# Patient Record
Sex: Female | Born: 1990 | Race: White | Hispanic: No | Marital: Married | Smoking: Never smoker
Health system: Southern US, Community
[De-identification: ages and names within clinical notes are randomized; demographics above are authoritative.]

## PROBLEM LIST (undated history)

## (undated) DIAGNOSIS — A6920 Lyme disease, unspecified: Secondary | ICD-10-CM

## (undated) DIAGNOSIS — F32A Depression, unspecified: Secondary | ICD-10-CM

## (undated) DIAGNOSIS — F419 Anxiety disorder, unspecified: Secondary | ICD-10-CM

## (undated) HISTORY — PX: TONSILLECTOMY: SUR1361

---

## 2019-08-23 ENCOUNTER — Other Ambulatory Visit: Payer: Self-pay

## 2019-08-23 DIAGNOSIS — Z20822 Contact with and (suspected) exposure to covid-19: Secondary | ICD-10-CM

## 2019-08-25 LAB — NOVEL CORONAVIRUS, NAA: SARS-CoV-2, NAA: NOT DETECTED

## 2019-12-26 ENCOUNTER — Ambulatory Visit
Admission: EM | Admit: 2019-12-26 | Discharge: 2019-12-26 | Disposition: A | Discharge status: Home / Self Care | Payer: Self-pay | Attending: Emergency Medicine | Admitting: Emergency Medicine

## 2019-12-26 ENCOUNTER — Other Ambulatory Visit: Payer: Self-pay

## 2019-12-26 DIAGNOSIS — J029 Acute pharyngitis, unspecified: Secondary | ICD-10-CM

## 2019-12-26 LAB — POCT RAPID STREP A (OFFICE): Rapid Strep A Screen: NEGATIVE

## 2019-12-26 MED ORDER — AMOXICILLIN-POT CLAVULANATE 875-125 MG PO TABS: 1.0000 | ORAL_TABLET | Freq: Two times a day (BID) | ORAL | 0 refills | Status: AC

## 2019-12-26 MED ORDER — CEFTRIAXONE SODIUM 1 G IJ SOLR
1.0000 g | Freq: Once | INTRAMUSCULAR | Status: AC
  Administered 2019-12-26: 1 g via INTRAMUSCULAR

## 2019-12-26 MED ORDER — DEXAMETHASONE SODIUM PHOSPHATE 10 MG/ML IJ SOLN
10.0000 mg | Freq: Once | INTRAMUSCULAR | Status: AC
  Administered 2019-12-26: 10 mg via INTRAMUSCULAR

## 2019-12-26 NOTE — Discharge Instructions (Signed)
Based on symptoms and exam I am concerned for soft tissue infection.  Offered further evaluation and management in the ED.  Patient declines and would like to try outpatient therapy first.  Aware of risk associated with this decision including delayed care, missed diagnosis, organ damage, organ failure, and/or diagnosis.  Patient understands and is agreeable to plan listed below  Strep was negative.  Culture sent.   Drink plenty of water and rest 1 gram of rocephin given in office 10 mg of decadron given in office Augmentin prescribed.  Take as directed and to completion Call 911 or go to ER if you have any new or worsening symptoms fever, chills, nausea, worsening sore throat, drooling, inability to swallowing, difficulty breathing, chest pain, trouble swallowing, worsening symptoms despite medication, etc..Marland Kitchen

## 2019-12-26 NOTE — ED Triage Notes (Signed)
Pt presents with headaches, body aches and sore throat x 5 days. Denies fevers at home. Pt reports having covid in January. Denies any other symptoms. Denies relief with otc medications.

## 2019-12-26 NOTE — ED Provider Notes (Signed)
[facility]   [id number] 12/26/19 Arrival Time: 1542   CC: Sore throat  SUBJECTIVE: History from: patient.  Lucy Boardman is a 29 y.o. female who presents with HA, body aches, and sore throat x 5 days.  Denies sick exposure to COVID, flu or strep.  Had COVID in January.  Has tried OTC medications without relief.  Symptoms are made worse with swallowing, but tolerating own secretions and liquids.  Reports previous symptoms in the past with "throat infection."  Complains of muffled sounding voice.    Denies fever, chills, fatigue, sinus pain, rhinorrhea, SOB, wheezing, chest pain, nausea, changes in bowel or bladder habits.    ROS: As per HPI.  All other pertinent ROS negative.     History reviewed. No pertinent past medical history. Past Surgical History:  Procedure Laterality Date  . TONSILLECTOMY     No Known Allergies No current facility-administered medications on file prior to encounter.   No current outpatient medications on file prior to encounter.   Social History   Socioeconomic History  . Marital status: Married    Spouse name: Not on file  . Number of children: Not on file  . Years of education: Not on file  . Highest education level: Not on file  Occupational History  . Not on file  Tobacco Use  . Smoking status: Never Smoker  . Smokeless tobacco: Never Used  Substance and Sexual Activity  . Alcohol use: Yes    Comment: weekly  . Drug use: Never  . Sexual activity: Not on file  Other Topics Concern  . Not on file  Social History Narrative  . Not on file   Social Determinants of Health   Financial Resource Strain:   . Difficulty of Paying Living Expenses:   Food Insecurity:   . Worried About Programme researcher, broadcasting/film/video in the Last Year:   . Barista in the Last Year:   Transportation Needs:   . Freight forwarder (Medical):   Marland Kitchen Lack of Transportation (Non-Medical):   Physical Activity:   . Days of Exercise per Week:   . Minutes of  Exercise per Session:   Stress:   . Feeling of Stress :   Social Connections:   . Frequency of Communication with Friends and Family:   . Frequency of Social Gatherings with Friends and Family:   . Attends Religious Services:   . Active Member of Clubs or Organizations:   . Attends Banker Meetings:   Marland Kitchen Marital Status:   Intimate Partner Violence:   . Fear of Current or Ex-Partner:   . Emotionally Abused:   Marland Kitchen Physically Abused:   . Sexually Abused:    Family History  Problem Relation Age of Onset  . Heart disease Mother   . Healthy Father     OBJECTIVE:  Vitals:   12/26/19 1554  BP: 119/81  Pulse: (!) 126  Resp: 18  Temp: 99.9 F (37.7 C)  SpO2: 95%     General appearance: alert; appears fatigued, but nontoxic; speaking in full sentences HEENT: NCAT; Ears: EACs clear, TMs pearly gray; Eyes: PERRL.  EOM grossly intact. Nose: nares patent without rhinorrhea, Throat: oropharynx clear, tonsils absent, soft palate erythematous, uvula midline, no obvious abscess, muffled voice, tolerating secretions Neck: supple without LAD Lungs: unlabored respirations, symmetrical air entry; cough: absent; no respiratory distress; CTAB Heart: Tachycardic Skin: warm and dry Psychological: alert and cooperative; normal mood and affect  LABS:  Results for orders  placed or performed during the hospital encounter of 12/26/19 (from the past 24 hour(s))  POCT rapid strep A     Status: None   Collection Time: 12/26/19  4:06 PM  Result Value Ref Range   Rapid Strep A Screen Negative Negative    ASSESSMENT & PLAN:  1. Throat infection   2. Sore throat     Meds ordered this encounter  Medications  . dexamethasone (DECADRON) injection 10 mg  . cefTRIAXone (ROCEPHIN) injection 1 g  . amoxicillin-clavulanate (AUGMENTIN) 875-125 MG tablet    Sig: Take 1 tablet by mouth every 12 (twelve) hours for 10 days.    Dispense:  20 tablet    Refill:  0    Order Specific Question:    Supervising Provider    Answer:   Raylene Everts [[id number]]   Based on symptoms and exam I am concerned for soft tissue infection.  Offered further evaluation and management in the ED.  Patient declines and would like to try outpatient therapy first.  Aware of risk associated with this decision including delayed care, missed diagnosis, organ damage, organ failure, and/or diagnosis.  Patient understands and is agreeable to plan listed below  Strep was negative.  Culture sent.   Drink plenty of water and rest 1 gram of rocephin given in office 10 mg of decadron given in office Augmentin prescribed.  Take as directed and to completion Call 911 or go to ER if you have any new or worsening symptoms fever, chills, nausea, worsening sore throat, drooling, inability to swallowing, difficulty breathing, chest pain, trouble swallowing, worsening symptoms despite medication, etc...  Reviewed expectations re: course of current medical issues. Questions answered. Outlined signs and symptoms indicating need for more acute intervention. Patient verbalized understanding. After Visit Summary given.         Lestine Box, PA-C 12/26/19 1616

## 2019-12-29 LAB — CULTURE, GROUP A STREP (THRC)

## 2020-04-15 ENCOUNTER — Encounter: Payer: Self-pay | Admitting: Emergency Medicine

## 2020-04-15 ENCOUNTER — Ambulatory Visit
Admission: EM | Admit: 2020-04-15 | Discharge: 2020-04-15 | Disposition: A | Discharge status: Home / Self Care | Attending: Emergency Medicine | Admitting: Emergency Medicine

## 2020-04-15 ENCOUNTER — Other Ambulatory Visit: Payer: Self-pay

## 2020-04-15 DIAGNOSIS — R519 Headache, unspecified: Secondary | ICD-10-CM | POA: Diagnosis present

## 2020-04-15 DIAGNOSIS — R000 Tachycardia, unspecified: Secondary | ICD-10-CM

## 2020-04-15 DIAGNOSIS — J029 Acute pharyngitis, unspecified: Secondary | ICD-10-CM

## 2020-04-15 HISTORY — DX: Depression, unspecified: F32.A

## 2020-04-15 HISTORY — DX: Anxiety disorder, unspecified: F41.9

## 2020-04-15 LAB — POCT RAPID STREP A (OFFICE): Rapid Strep A Screen: NEGATIVE

## 2020-04-15 MED ORDER — IBUPROFEN 800 MG PO TABS
800.0000 mg | ORAL_TABLET | Freq: Three times a day (TID) | ORAL | 0 refills | Status: DC
Start: 2020-04-15 — End: 2020-06-22

## 2020-04-15 MED ORDER — CETIRIZINE HCL 10 MG PO TABS: 10.0000 mg | ORAL_TABLET | Freq: Every day | ORAL | 0 refills | Status: AC

## 2020-04-15 NOTE — Discharge Instructions (Addendum)
Strep negative culture sent.  We will follow up with you regarding abnormal results COVID testing ordered.  It will take between 2-5 days for test results.  Someone will contact you regarding abnormal results.    In the meantime: You should remain isolated in your home for 10 days from symptom onset AND greater than 72 hours after symptoms resolution (absence of fever without the use of fever-reducing medication and improvement in respiratory symptoms), whichever is longer Get plenty of rest and push fluids zyrtec for nasal congestion, runny nose, and/or sore throat Ibuprofen prescribed.  Use as directed for sore throat and headache Call or go to the ED if you have any new or worsening symptoms such as fever, cough, shortness of breath, chest tightness, chest pain, turning blue, changes in mental status, etc..Marland Kitchen

## 2020-04-15 NOTE — ED Triage Notes (Signed)
Sore throat and headache x3 days

## 2020-04-15 NOTE — ED Provider Notes (Signed)
[facility]   [id number] 04/15/20 Arrival Time: 0857   CC: COVID symptoms  SUBJECTIVE: History from: patient.  Kelly Holt is a 29 y.o. female who presents with abrupt onset of headache and sore throat x few days.  Denies sick exposure to COVID, flu or strep.   Has tried OTC medications without relief.  Symptoms are made worse with swallowing, but tolerating own secretions.  Reports previous symptoms in the past with throat infection.   Denies fever, chills, sinus pain, rhinorrhea, cough, SOB, wheezing, chest pain, nausea, vomiting, changes in bowel or bladder habits.     ROS: As per HPI.  All other pertinent ROS negative.     Past Medical History:  Diagnosis Date  . Anxiety   . Depression    Past Surgical History:  Procedure Laterality Date  . TONSILLECTOMY     No Known Allergies No current facility-administered medications on file prior to encounter.   Current Outpatient Medications on File Prior to Encounter  Medication Sig Dispense Refill  . FLUoxetine (PROZAC) 20 MG tablet Take 20 mg by mouth daily.     Social History   Socioeconomic History  . Marital status: Married    Spouse name: Not on file  . Number of children: Not on file  . Years of education: Not on file  . Highest education level: Not on file  Occupational History  . Not on file  Tobacco Use  . Smoking status: Never Smoker  . Smokeless tobacco: Never Used  Substance and Sexual Activity  . Alcohol use: Yes    Comment: weekly  . Drug use: Never  . Sexual activity: Not on file  Other Topics Concern  . Not on file  Social History Narrative  . Not on file   Social Determinants of Health   Financial Resource Strain:   . Difficulty of Paying Living Expenses:   Food Insecurity:   . Worried About Programme researcher, broadcasting/film/video in the Last Year:   . Barista in the Last Year:   Transportation Needs:   . Freight forwarder (Medical):   Marland Kitchen Lack of Transportation (Non-Medical):   Physical  Activity:   . Days of Exercise per Week:   . Minutes of Exercise per Session:   Stress:   . Feeling of Stress :   Social Connections:   . Frequency of Communication with Friends and Family:   . Frequency of Social Gatherings with Friends and Family:   . Attends Religious Services:   . Active Member of Clubs or Organizations:   . Attends Banker Meetings:   Marland Kitchen Marital Status:   Intimate Partner Violence:   . Fear of Current or Ex-Partner:   . Emotionally Abused:   Marland Kitchen Physically Abused:   . Sexually Abused:    Family History  Problem Relation Age of Onset  . Heart disease Mother   . Healthy Father     OBJECTIVE:  Vitals:   04/15/20 0928  BP: 124/76  Pulse: (!) 125  Resp: 18  Temp: 99.6 F (37.6 C)  TempSrc: Oral  SpO2: 97%  Weight: 160 lb (72.6 kg)  Height: 5\' 7"  (1.702 m)     General appearance: alert; appears fatigued, but nontoxic; speaking in full sentences and tolerating own secretions HEENT: NCAT; Ears: EACs clear, TMs pearly gray; Eyes: PERRL.  EOM grossly intact.  Nose: nares patent without rhinorrhea, Throat: oropharynx clear, tonsils erythematous not enlarged, uvula midline  Neck: supple without LAD Lungs:  unlabored respirations, symmetrical air entry; cough: absent; no respiratory distress; CTAB Heart: Tachycardic Skin: warm and dry Psychological: alert and cooperative; normal mood and affect  LABS:  Results for orders placed or performed during the hospital encounter of 04/15/20 (from the past 24 hour(s))  POCT rapid strep A     Status: None   Collection Time: 04/15/20  9:32 AM  Result Value Ref Range   Rapid Strep A Screen Negative Negative     ASSESSMENT & PLAN:  1. Sore throat   2. Acute nonintractable headache, unspecified headache type     Meds ordered this encounter  Medications  . cetirizine (ZYRTEC) 10 MG tablet    Sig: Take 1 tablet (10 mg total) by mouth daily.    Dispense:  30 tablet    Refill:  0    Order Specific  Question:   Supervising Provider    Answer:   Eustace Moore [[id number]]  . ibuprofen (ADVIL) 800 MG tablet    Sig: Take 1 tablet (800 mg total) by mouth 3 (three) times daily.    Dispense:  21 tablet    Refill:  0    Order Specific Question:   Supervising Provider    Answer:   Eustace Moore [[id number]]   Strep negative culture sent.  We will follow up with you regarding abnormal results COVID testing ordered.  It will take between 2-5 days for test results.  Someone will contact you regarding abnormal results.    In the meantime: You should remain isolated in your home for 10 days from symptom onset AND greater than 72 hours after symptoms resolution (absence of fever without the use of fever-reducing medication and improvement in respiratory symptoms), whichever is longer Get plenty of rest and push fluids zyrtec for nasal congestion, runny nose, and/or sore throat Ibuprofen prescribed.  Use as directed for sore throat and headache Call or go to the ED if you have any new or worsening symptoms such as fever, cough, shortness of breath, chest tightness, chest pain, turning blue, changes in mental status, etc...   Heart rate elevated in office.  Was elevated during her last visit as well.  States this is normal.  Encouraged patient to establish primary care for further work-up as needed.    Reviewed expectations re: course of current medical issues. Questions answered. Outlined signs and symptoms indicating need for more acute intervention. Patient verbalized understanding. After Visit Summary given.         Alvino [place], PA-C 04/15/20 [phone]

## 2020-04-16 LAB — SARS-COV-2, NAA 2 DAY TAT

## 2020-04-16 LAB — NOVEL CORONAVIRUS, NAA: SARS-CoV-2, NAA: NOT DETECTED

## 2020-04-17 ENCOUNTER — Ambulatory Visit
Admission: EM | Admit: 2020-04-17 | Discharge: 2020-04-17 | Disposition: A | Discharge status: Home / Self Care | Attending: Family Medicine | Admitting: Family Medicine

## 2020-04-17 ENCOUNTER — Other Ambulatory Visit: Payer: Self-pay

## 2020-04-17 DIAGNOSIS — R509 Fever, unspecified: Secondary | ICD-10-CM

## 2020-04-17 DIAGNOSIS — R000 Tachycardia, unspecified: Secondary | ICD-10-CM | POA: Diagnosis not present

## 2020-04-17 DIAGNOSIS — J029 Acute pharyngitis, unspecified: Secondary | ICD-10-CM | POA: Diagnosis not present

## 2020-04-17 MED ORDER — CEFTRIAXONE SODIUM 500 MG IJ SOLR
500.0000 mg | Freq: Once | INTRAMUSCULAR | Status: AC
  Administered 2020-04-17: 500 mg via INTRAMUSCULAR

## 2020-04-17 MED ORDER — FLUCONAZOLE 200 MG PO TABS: 200.0000 mg | ORAL_TABLET | Freq: Once | ORAL | 0 refills | Status: AC

## 2020-04-17 MED ORDER — AMOXICILLIN-POT CLAVULANATE 875-125 MG PO TABS: 1.0000 | ORAL_TABLET | Freq: Two times a day (BID) | ORAL | 0 refills | Status: AC

## 2020-04-17 MED ORDER — METHYLPREDNISOLONE SODIUM SUCC 125 MG IJ SOLR
125.0000 mg | Freq: Once | INTRAMUSCULAR | Status: AC
  Administered 2020-04-17: 125 mg via INTRAMUSCULAR

## 2020-04-17 NOTE — Discharge Instructions (Addendum)
We have given you Rocephin (antibiotic) and solumedrol (steroid) in the office today  I have sent Augmentin to your pharmacy  Follow up with this office or with primary care if you are not improving over the next 2 days on antibiotics  Follow up with the ER for trouble swallowing, trouble breathing, other concerning symptoms

## 2020-04-17 NOTE — ED Provider Notes (Signed)
[facility]   [id number] 04/17/20 Arrival Time: 1618  GH:WEXH THROAT  SUBJECTIVE: History from: patient.  Kelly Holt is a 29 y.o. female who presents with sore throat, fever, chills, fatigue for the last 4-5 days. Was seen in this office 2 days ago and had negative rapid strep and negative Covid testing. Strep culture is still pending. Has been taking ibuprofen and zyrtec with no relief. Reports that her throat feels more swollen than it did at her last visit. Symptoms are made worse with swallowing, but tolerating liquids and own secretions without difficulty.  Reports previous symptoms in the past.     Denies  ear pain, sinus pain, rhinorrhea, nasal congestion, cough, SOB, wheezing, chest pain, nausea, rash, changes in bowel or bladder habits.     ROS: As per HPI.  All other pertinent ROS negative.     Past Medical History:  Diagnosis Date  . Anxiety   . Depression    Past Surgical History:  Procedure Laterality Date  . TONSILLECTOMY     No Known Allergies No current facility-administered medications on file prior to encounter.   Current Outpatient Medications on File Prior to Encounter  Medication Sig Dispense Refill  . cetirizine (ZYRTEC) 10 MG tablet Take 1 tablet (10 mg total) by mouth daily. 30 tablet 0  . FLUoxetine (PROZAC) 20 MG tablet Take 20 mg by mouth daily.    Marland Kitchen ibuprofen (ADVIL) 800 MG tablet Take 1 tablet (800 mg total) by mouth 3 (three) times daily. 21 tablet 0   Social History   Socioeconomic History  . Marital status: Married    Spouse name: Not on file  . Number of children: Not on file  . Years of education: Not on file  . Highest education level: Not on file  Occupational History  . Not on file  Tobacco Use  . Smoking status: Never Smoker  . Smokeless tobacco: Never Used  Substance and Sexual Activity  . Alcohol use: Yes    Comment: weekly  . Drug use: Never  . Sexual activity: Not on file  Other Topics Concern  . Not on file    Social History Narrative  . Not on file   Social Determinants of Health   Financial Resource Strain:   . Difficulty of Paying Living Expenses:   Food Insecurity:   . Worried About Programme researcher, broadcasting/film/video in the Last Year:   . Barista in the Last Year:   Transportation Needs:   . Freight forwarder (Medical):   Marland Kitchen Lack of Transportation (Non-Medical):   Physical Activity:   . Days of Exercise per Week:   . Minutes of Exercise per Session:   Stress:   . Feeling of Stress :   Social Connections:   . Frequency of Communication with Friends and Family:   . Frequency of Social Gatherings with Friends and Family:   . Attends Religious Services:   . Active Member of Clubs or Organizations:   . Attends Banker Meetings:   Marland Kitchen Marital Status:   Intimate Partner Violence:   . Fear of Current or Ex-Partner:   . Emotionally Abused:   Marland Kitchen Physically Abused:   . Sexually Abused:    Family History  Problem Relation Age of Onset  . Heart disease Mother   . Healthy Father     OBJECTIVE:  Vitals:   04/17/20 1639  BP: 115/75  Pulse: (!) 127  Resp: 20  Temp: 99.4 F (37.4  C)  SpO2: 97%     General appearance: alert; appears fatigued, but nontoxic, speaking in full sentences and managing own secretions HEENT: NCAT; Ears: EACs clear, TMs pearly gray with visible cone of light, without erythema; Eyes: PERRL, EOMI grossly; Nose: no obvious rhinorrhea; Throat: oropharynx erythematous and +1without white tonsillar exudates, uvula midline Neck: supple with bilateral LAD Lungs: CTA bilaterally without adventitious breath sounds; cough absent Heart: regular rate and rhythm.  Radial pulses 2+ symmetrical bilaterally Skin: warm and dry Psychological: alert and cooperative; normal mood and affect  LABS: No results found for this or any previous visit (from the past 24 hour(s)).   ASSESSMENT & PLAN:  1. Throat infection   2. Fever, unspecified fever cause   3.  Tachycardia     Meds ordered this encounter  Medications  . methylPREDNISolone sodium succinate (SOLU-MEDROL) 125 mg/2 mL injection 125 mg  . cefTRIAXone (ROCEPHIN) injection 500 mg  . amoxicillin-clavulanate (AUGMENTIN) 875-125 MG tablet    Sig: Take 1 tablet by mouth 2 (two) times daily for 10 days.    Dispense:  20 tablet    Refill:  0    Order Specific Question:   Supervising Provider    Answer:   Merrilee Jansky [id number]  . fluconazole (DIFLUCAN) 200 MG tablet    Sig: Take 1 tablet (200 mg total) by mouth once for 1 dose.    Dispense:  2 tablet    Refill:  0    Order Specific Question:   Supervising Provider    Answer:   Merrilee Jansky [[id number]]    Given solumedrol in office Given Rocephin in office Prescribed Augmentin Prescribed fluconazole in case of yeast Strep culture still pending Drink warm or cool liquids, use throat lozenges, or popsicles to help alleviate symptoms Take OTC ibuprofen or tylenol as needed for pain Follow up with PCP if symptoms persists Return or go to ER if patient has any new or worsening symptoms such as fever, chills, nausea, vomiting, worsening sore throat, cough, abdominal pain, chest pain, changes in bowel or bladder habits  Reviewed expectations re: course of current medical issues. Questions answered. Outlined signs and symptoms indicating need for more acute intervention. Patient verbalized understanding. After Visit Summary given.          Moshe Cipro, NP 04/17/20 1747

## 2020-04-17 NOTE — ED Triage Notes (Signed)
Pt has worsening sore throat , cultures pending

## 2020-04-18 LAB — CULTURE, GROUP A STREP (THRC)

## 2020-06-22 ENCOUNTER — Ambulatory Visit (HOSPITAL_COMMUNITY)
Admission: RE | Admit: 2020-06-22 | Discharge: 2020-06-22 | Disposition: A | Discharge status: Home / Self Care | Source: Ambulatory Visit | Attending: Emergency Medicine | Admitting: Emergency Medicine

## 2020-06-22 ENCOUNTER — Other Ambulatory Visit: Payer: Self-pay

## 2020-06-22 ENCOUNTER — Ambulatory Visit
Admission: EM | Admit: 2020-06-22 | Discharge: 2020-06-22 | Disposition: A | Discharge status: Home / Self Care | Attending: Emergency Medicine | Admitting: Emergency Medicine

## 2020-06-22 DIAGNOSIS — M79642 Pain in left hand: Secondary | ICD-10-CM

## 2020-06-22 DIAGNOSIS — X58XXXA Exposure to other specified factors, initial encounter: Secondary | ICD-10-CM | POA: Insufficient documentation

## 2020-06-22 DIAGNOSIS — S6992XA Unspecified injury of left wrist, hand and finger(s), initial encounter: Secondary | ICD-10-CM | POA: Diagnosis present

## 2020-06-22 MED ORDER — IBUPROFEN 800 MG PO TABS: 800.0000 mg | ORAL_TABLET | Freq: Three times a day (TID) | ORAL | 0 refills | Status: AC
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
## 2020-06-22 NOTE — Discharge Instructions (Addendum)
Take ibuprofen 800 mg as prescribed for pain Complete x-ray that was ordered.  We will call you if your result is abnormal. Follow-up with PCP/orthopedic Return or go to ED for worsening of symptoms

## 2020-10-27 ENCOUNTER — Ambulatory Visit
Admission: EM | Admit: 2020-10-27 | Discharge: 2020-10-27 | Disposition: A | Discharge status: Home / Self Care | Attending: Emergency Medicine | Admitting: Emergency Medicine

## 2020-10-27 ENCOUNTER — Other Ambulatory Visit: Payer: Self-pay

## 2020-10-27 ENCOUNTER — Encounter: Payer: Self-pay | Admitting: Emergency Medicine

## 2020-10-27 DIAGNOSIS — R52 Pain, unspecified: Secondary | ICD-10-CM

## 2020-10-27 DIAGNOSIS — R110 Nausea: Secondary | ICD-10-CM | POA: Diagnosis not present

## 2020-10-27 DIAGNOSIS — R519 Headache, unspecified: Secondary | ICD-10-CM

## 2020-10-27 DIAGNOSIS — Z1152 Encounter for screening for COVID-19: Secondary | ICD-10-CM

## 2020-10-27 DIAGNOSIS — R509 Fever, unspecified: Secondary | ICD-10-CM

## 2020-10-27 MED ORDER — PREDNISONE 10 MG PO TABS
20.0000 mg | ORAL_TABLET | Freq: Every day | ORAL | 0 refills | Status: AC
Start: 2020-10-27 — End: 2020-11-01

## 2020-10-27 MED ORDER — ONDANSETRON 4 MG PO TBDP: 4.0000 mg | ORAL_TABLET | Freq: Three times a day (TID) | ORAL | 0 refills | Status: AC | PRN

## 2020-10-27 MED ORDER — ACETAMINOPHEN 325 MG PO TABS
650.0000 mg | ORAL_TABLET | Freq: Once | ORAL | Status: AC
  Administered 2020-10-27: 650 mg via ORAL

## 2020-10-27 NOTE — ED Triage Notes (Signed)
Pt states that she has a fever , nausea, HA, sweats/chills, and body aches that started this morning.

## 2020-10-27 NOTE — ED Provider Notes (Signed)
[facility]   [id number] 10/27/20 Arrival Time: 1748   CC: COVID symptoms  SUBJECTIVE: History from: patient.  Kelly Holt is a 30 y.o. female who who presented to the urgent care with a complaint of chills, fever, nausea, headache and body ache that started this morning.  Denies sick exposure to COVID, flu or strep.  Denies recent travel.  Has tried OTC Tylenol with mild relief.  Denies alleviating or aggravating factors.  Denies previous symptoms in the past.   Denies fatigue, sinus pain, rhinorrhea, sore throat, SOB, wheezing, chest pain, nausea, changes in bowel or bladder habits.     ROS: As per HPI.  All other pertinent ROS negative.      Past Medical History:  Diagnosis Date  . Anxiety   . Depression    Past Surgical History:  Procedure Laterality Date  . TONSILLECTOMY     No Known Allergies No current facility-administered medications on file prior to encounter.   Current Outpatient Medications on File Prior to Encounter  Medication Sig Dispense Refill  . cetirizine (ZYRTEC) 10 MG tablet Take 1 tablet (10 mg total) by mouth daily. 30 tablet 0  . FLUoxetine (PROZAC) 20 MG tablet Take 20 mg by mouth daily.    Marland Kitchen ibuprofen (ADVIL) 800 MG tablet Take 1 tablet (800 mg total) by mouth 3 (three) times daily. 30 tablet 0   Social History   Socioeconomic History  . Marital status: Married    Spouse name: Not on file  . Number of children: Not on file  . Years of education: Not on file  . Highest education level: Not on file  Occupational History  . Not on file  Tobacco Use  . Smoking status: Never Smoker  . Smokeless tobacco: Never Used  Substance and Sexual Activity  . Alcohol use: Yes    Comment: weekly  . Drug use: Never  . Sexual activity: Not on file  Other Topics Concern  . Not on file  Social History Narrative  . Not on file   Social Determinants of Health   Financial Resource Strain: Not on file  Food Insecurity: Not on file  Transportation  Needs: Not on file  Physical Activity: Not on file  Stress: Not on file  Social Connections: Not on file  Intimate Partner Violence: Not on file   Family History  Problem Relation Age of Onset  . Heart disease Mother   . Healthy Father     OBJECTIVE:  Vitals:   10/27/20 1801  BP: 128/84  Pulse: (!) 126  Resp: 19  Temp: (!) 100.6 F (38.1 C)  TempSrc: Oral  SpO2: 96%     General appearance: alert; appears fatigued, but nontoxic; speaking in full sentences and tolerating own secretions HEENT: NCAT; Ears: EACs clear, TMs pearly gray; Eyes: PERRL.  EOM grossly intact. Sinuses: nontender; Nose: nares patent without rhinorrhea, Throat: oropharynx clear, tonsils non erythematous or enlarged, uvula midline  Neck: supple without LAD Lungs: unlabored respirations, symmetrical air entry; cough:none; no respiratory distress; CTAB Heart: regular rate and rhythm.  Radial pulses 2+ symmetrical bilaterally Skin: warm and dry Psychological: alert and cooperative; normal mood and affect  LABS:  No results found for this or any previous visit (from the past 24 hour(s)).   ASSESSMENT & PLAN:  1. Encounter for screening for COVID-19   2. Chills with fever   3. Nausea without vomiting   4. Acute nonintractable headache, unspecified headache type   5. Generalized body aches  Meds ordered this encounter  Medications  . acetaminophen (TYLENOL) tablet 650 mg  . ondansetron (ZOFRAN ODT) 4 MG disintegrating tablet    Sig: Take 1 tablet (4 mg total) by mouth every 8 (eight) hours as needed for nausea or vomiting.    Dispense:  30 tablet    Refill:  0  . predniSONE (DELTASONE) 10 MG tablet    Sig: Take 2 tablets (20 mg total) by mouth daily for 5 days.    Dispense:  10 tablet    Refill:  0    Discharge Instructions  COVID-19 testing ordered.  It will take between 2-7 days for test results.  Someone will contact you regarding abnormal results.    Get plenty of rest and push  fluids Zofran was prescribed for nausea e prednisone was prescribed Use OTC medications like ibuprofen or tylenol as needed fever or pain Use medications daily for symptomatic relief Call or go to the ED if you have any new or worsening symptoms such as fever, worsening cough, shortness of breath, chest tightness, chest pain, turning blue, changes in mental status, etc...   Reviewed expectations re: course of current medical issues. Questions answered. Outlined signs and symptoms indicating need for more acute intervention. Patient verbalized understanding. After Visit Summary given.         Durward Parcel, FNP 10/27/20 [phone]

## 2020-10-27 NOTE — Discharge Instructions (Signed)
COVID-19 testing ordered.  It will take between 2-7 days for test results.  Someone will contact you regarding abnormal results.    Get plenty of rest and push fluids Zofran was prescribed for nausea e prednisone was prescribed Use OTC medications like ibuprofen or tylenol as needed fever or pain Use medications daily for symptomatic relief Call or go to the ED if you have any new or worsening symptoms such as fever, worsening cough, shortness of breath, chest tightness, chest pain, turning blue, changes in mental status, etc..Marland Kitchen

## 2020-10-28 LAB — NOVEL CORONAVIRUS, NAA: SARS-CoV-2, NAA: NOT DETECTED

## 2020-10-28 LAB — SARS-COV-2, NAA 2 DAY TAT

## 2021-09-01 ENCOUNTER — Encounter (HOSPITAL_COMMUNITY): Payer: Self-pay | Admitting: *Deleted

## 2021-09-01 ENCOUNTER — Emergency Department (HOSPITAL_COMMUNITY)
Admission: EM | Admit: 2021-09-01 | Discharge: 2021-09-01 | Disposition: A | Discharge status: Home / Self Care | Attending: Emergency Medicine | Admitting: Emergency Medicine

## 2021-09-01 ENCOUNTER — Other Ambulatory Visit: Payer: Self-pay

## 2021-09-01 DIAGNOSIS — K068 Other specified disorders of gingiva and edentulous alveolar ridge: Secondary | ICD-10-CM

## 2021-09-01 DIAGNOSIS — K1379 Other lesions of oral mucosa: Secondary | ICD-10-CM | POA: Insufficient documentation

## 2021-09-01 NOTE — ED Provider Notes (Addendum)
[facility] EMERGENCY DEPARTMENT Provider Note   CSN: [id number] Arrival date & time: 09/01/21  2031     History Chief Complaint  Patient presents with   Oral Swelling    Kelly Holt is a 30 y.o. female.  30 year old female presents today for evaluation of pain in her gums that has been going on since Thursday.  She reports pain is worse when chewing, or brushing her teeth.  She denies difficulty swallowing, sore throat, or fever.  She has not had dental care in 2 to 3 years.  [facility] and was prescribed lidocaine mouthwash which she reports has not provided her with much relief.  She does report Motrin gives her some relief.  The history is provided by the patient. No language interpreter was used.      Past Medical History:  Diagnosis Date   Anxiety    Depression     There are no problems to display for this patient.   Past Surgical History:  Procedure Laterality Date   TONSILLECTOMY       OB History   No obstetric history on file.     Family History  Problem Relation Age of Onset   Heart disease Mother    Healthy Father     Social History   Tobacco Use   Smoking status: Never   Smokeless tobacco: Never  Substance Use Topics   Alcohol use: Yes    Comment: weekly   Drug use: Never    Home Medications Prior to Admission medications   Medication Sig Start Date End Date Taking? Authorizing Provider  cetirizine (ZYRTEC) 10 MG tablet Take 1 tablet (10 mg total) by mouth daily. 04/15/20   Wurst, [place], PA-C  FLUoxetine (PROZAC) 20 MG tablet Take 20 mg by mouth daily.    [provider]  ibuprofen (ADVIL) 800 MG tablet Take 1 tablet (800 mg total) by mouth 3 (three) times daily. 06/22/20   Avegno, Zachery Dakins, FNP  ondansetron (ZOFRAN ODT) 4 MG disintegrating tablet Take 1 tablet (4 mg total) by mouth every 8 (eight) hours as needed for nausea or vomiting. 10/27/20   Durward Parcel, FNP    Allergies     Patient has no known allergies.  Review of Systems   Review of Systems  Constitutional:  Negative for activity change, chills and fever.  HENT:  Negative for sore throat, trouble swallowing and voice change.   Respiratory:  Negative for cough and shortness of breath.   Gastrointestinal:  Negative for nausea.  Skin:  Negative for wound.  Hematological:  Does not bruise/bleed easily.  All other systems reviewed and are negative.  Physical Exam Updated Vital Signs BP 130/90 (BP Location: Right Arm)    Pulse (!) 110    Temp 98.4 F (36.9 C) (Oral)    Resp 16    Ht 5\' 7"  (1.702 m)    Wt 83.9 kg    LMP 08/04/2021    SpO2 100%    BMI 28.98 kg/m   Physical Exam Vitals and nursing note reviewed.  Constitutional:      General: She is not in acute distress.    Appearance: Normal appearance. She is not ill-appearing.  HENT:     Head: Normocephalic and atraumatic.     Nose: Nose normal.     Mouth/Throat:     Dentition: Abnormal dentition. Dental caries present. No dental tenderness, gingival swelling, dental abscesses or gum lesions.  Pharynx: Oropharynx is clear. Uvula midline. No pharyngeal swelling, oropharyngeal exudate or posterior oropharyngeal erythema.     Tonsils: No tonsillar exudate or tonsillar abscesses.  Eyes:     Conjunctiva/sclera: Conjunctivae normal.  Pulmonary:     Effort: Pulmonary effort is normal. No respiratory distress.  Musculoskeletal:        General: No deformity.  Skin:    Findings: No rash.  Neurological:     Mental Status: She is alert.    ED Results / Procedures / Treatments   Labs (all labs ordered are listed, but only abnormal results are displayed) Labs Reviewed - No data to display  EKG None  Radiology No results found.  Procedures Procedures   Medications Ordered in ED Medications - No data to display  ED Course  I have reviewed the triage vital signs and the nursing notes.  Pertinent labs & imaging results that were available  during my care of the patient were reviewed by me and considered in my medical decision making (see chart for details).    MDM Rules/Calculators/A&P                           30 year old female presents today for evaluation of gum pain since Thursday.  [facility] and prescribed lidocaine mouthwash that has not provided much relief.  She has received relief from Motrin.  She denies fever.  Her exam overall today is reassuring and without concern for peritonsillar abscess, dental abscess, or localized inflammation.  Symptomatic treatment discussed with using Tylenol, Motrin, or the lidocaine mouthwash provided to her this morning.  Patient does not have difficulty swallowing.  She reports she has not been adequately hydrating.  In my interview patient's heart rate about 95-100.  Discussed importance of increasing her hydration.  Dental referral also given.  Return precautions discussed.  Patient voices understanding and is in agreement with plan.  Final Clinical Impression(s) / ED Diagnoses Final diagnoses:  Pain in gums    Rx / DC Orders ED Discharge Orders     None        Marita Kansas, PA-C 09/01/21 2136    Marita Kansas, PA-C 09/01/21 2136    Benjiman Core, MD 09/02/21 [phone]

## 2021-09-01 NOTE — Discharge Instructions (Addendum)
As discussed your exam did not show any signs of inflammation, or infection and is overall reassuring.  As discussed this could be periodontal gum disease, but we cannot diagnose this from the emergency room.  You do need to follow-up with dentist and establish care.  I have attached our on-call dentist.  Please give them a call tomorrow.  I have also attached additional dental resource for you.  Continue using Tylenol and Motrin for pain relief.  If you are able to tolerate the mouthwash you received earlier today for provider to with relief feel free to continue using those as well.  If you develop worsening swelling, difficulty swallowing, or difficulty breathing please return to the emergency room.  You may also want to discuss with prescriber for Wellbutrin if that is contributing to this as well.

## 2021-09-01 NOTE — ED Notes (Signed)
ED Provider at bedside. 

## 2021-09-01 NOTE — ED Triage Notes (Signed)
Pt with c/o gum swelling, redness, painful and bleeding since Thursday. Did telemedicine earlier today and was prescribed a mouthwash.  Pain is worse.

## 2022-03-04 IMAGING — DX DG HAND COMPLETE 3+V*L*
3 series · 3 of 3 positions shown · non-contrast
Comparison: None.

CLINICAL DATA: Left hand pain after injury

EXAM:
LEFT HAND - COMPLETE 3+ VIEW

[hand pa]
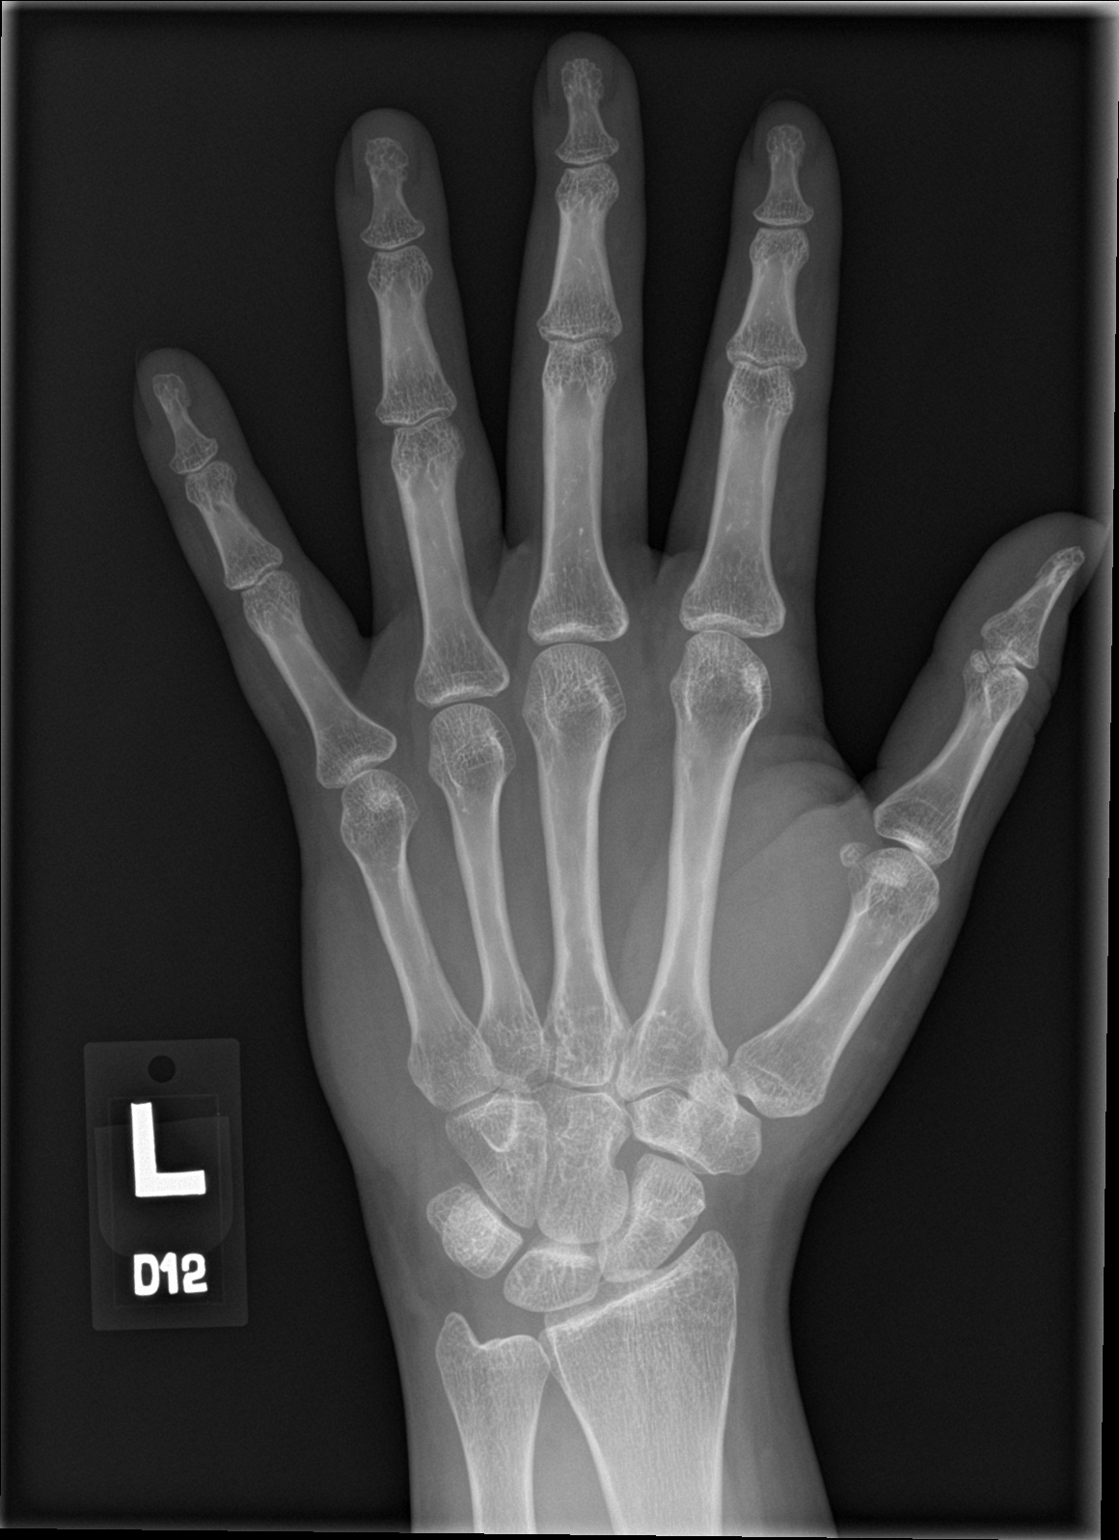

[hand obl]
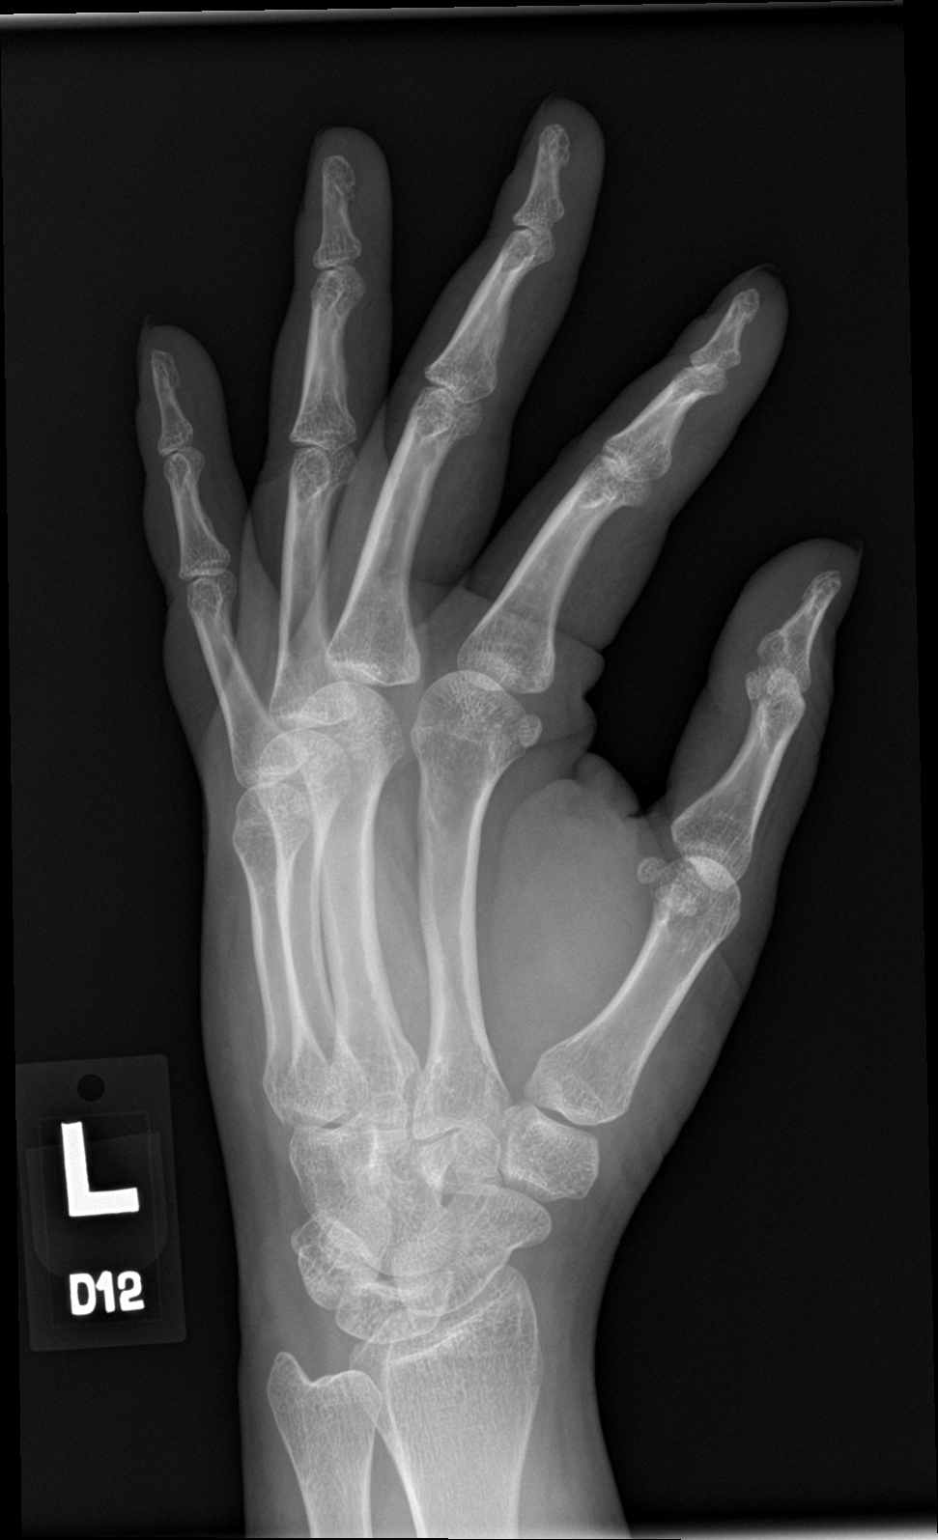

[hand lat]
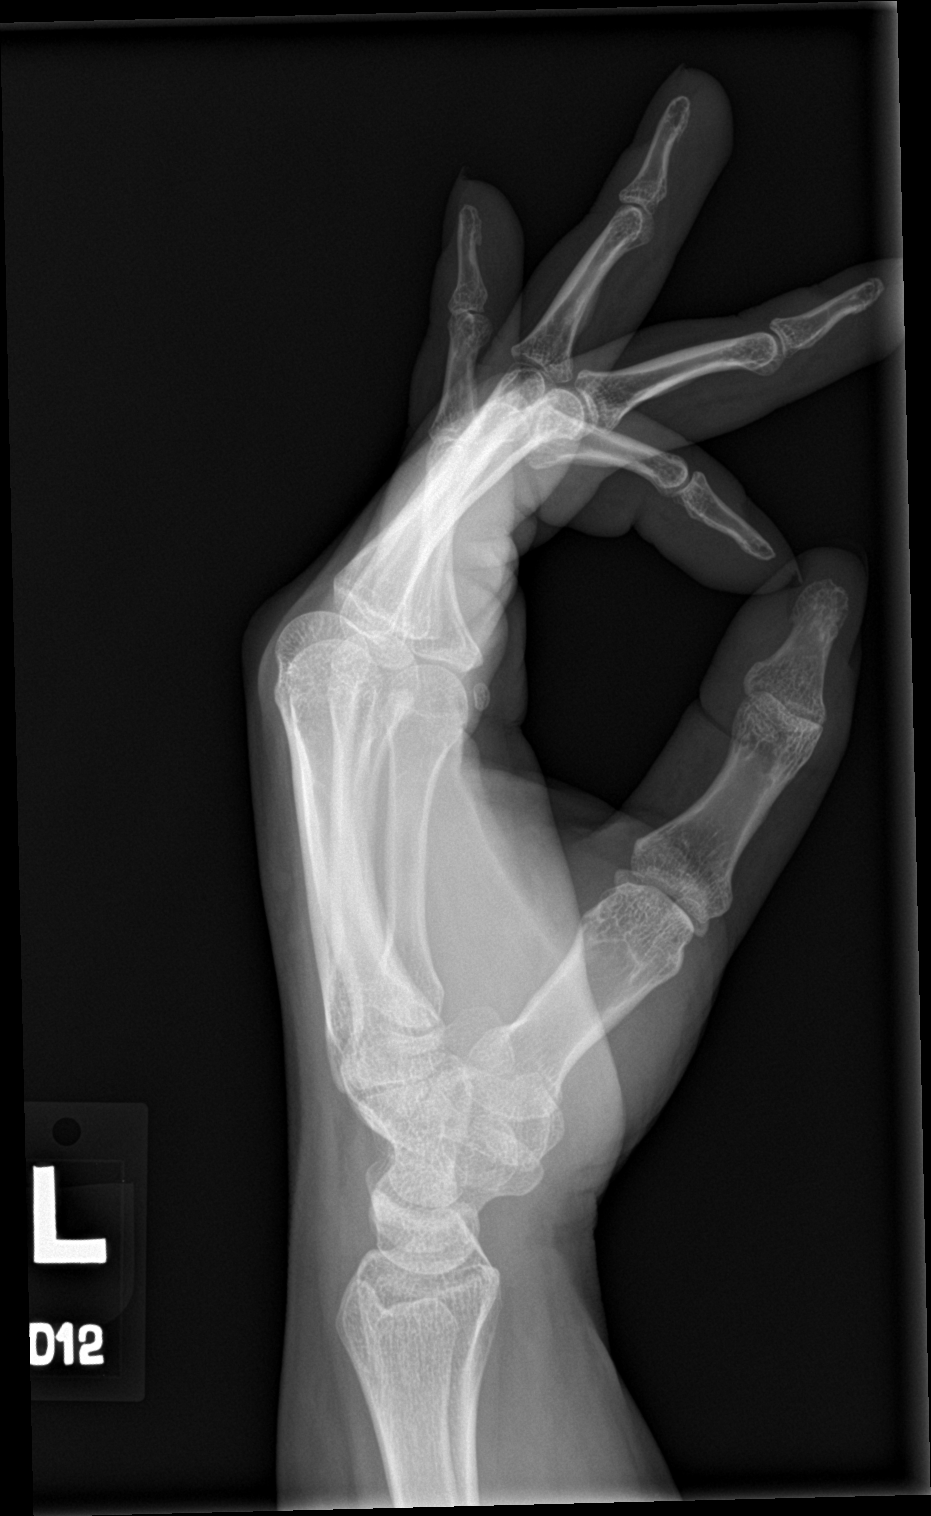

[3 of 3 positions shown; findings below may reference images not displayed]

FINDINGS: There is no evidence of fracture or dislocation. There is no
evidence of arthropathy or other focal bone abnormality. Soft
tissues are unremarkable.
IMPRESSION: Negative.

## 2023-10-01 ENCOUNTER — Ambulatory Visit
Admission: EM | Admit: 2023-10-01 | Discharge: 2023-10-01 | Disposition: A | Discharge status: Home / Self Care | Payer: No Typology Code available for payment source | Attending: Family Medicine | Admitting: Family Medicine

## 2023-10-01 ENCOUNTER — Ambulatory Visit (INDEPENDENT_AMBULATORY_CARE_PROVIDER_SITE_OTHER): Payer: Self-pay

## 2023-10-01 DIAGNOSIS — R509 Fever, unspecified: Secondary | ICD-10-CM | POA: Diagnosis not present

## 2023-10-01 DIAGNOSIS — M2550 Pain in unspecified joint: Secondary | ICD-10-CM | POA: Insufficient documentation

## 2023-10-01 LAB — POCT URINALYSIS DIP (MANUAL ENTRY)
Blood, UA: NEGATIVE
Glucose, UA: NEGATIVE mg/dL
Nitrite, UA: NEGATIVE
Protein Ur, POC: 100 mg/dL — AB
Spec Grav, UA: >=1.03 — AB (ref 1.010–1.025)
Urobilinogen, UA: 1 U/dL
pH, UA: 5.5 (ref 5.0–8.0)

## 2023-10-01 NOTE — ED Triage Notes (Signed)
 Pt reports fever headaches, sore throat, fatigued body aches x 2 weeks andh as not gotten any better was given an antibiotic and has not started to feel any better. States fever has been consistent the entire duration of sx's

## 2023-10-01 NOTE — ED Provider Notes (Signed)
 [facility]    CSN: [id number] Arrival date & time: 10/01/23  1153      History   Chief Complaint No chief complaint on file.   HPI Kelly Holt is a 33 y.o. female.   Patient presenting today with 2-week history of fever, headache, fatigue, body aches and joint pains.  Very mild sore throat at times but nothing significant per patient.  Was seen a week or so ago and given amoxicillin  for suspected sinus infection but states has not noticed any improvement in symptoms and never had any significant cough or congestion.  States her fevers come and go throughout the day, typically around 101 F.  No known rashes, chest pain, shortness of breath, abdominal pain, nausea vomiting diarrhea, known tick bites, sick contacts.  No known pertinent chronic medical problems.    Past Medical History:  Diagnosis Date   Anxiety    Depression     There are no active problems to display for this patient.   Past Surgical History:  Procedure Laterality Date   TONSILLECTOMY      OB History   No obstetric history on file.      Home Medications    Prior to Admission medications   Medication Sig Start Date End Date Taking? Authorizing Provider  cetirizine  (ZYRTEC ) 10 MG tablet Take 1 tablet (10 mg total) by mouth daily. 04/15/20   Wurst, Brittany, PA-C  FLUoxetine (PROZAC) 20 MG tablet Take 20 mg by mouth daily.    [provider]  ibuprofen  (ADVIL ) 800 MG tablet Take 1 tablet (800 mg total) by mouth 3 (three) times daily. 06/22/20   Avegno, Komlanvi S, FNP  ondansetron  (ZOFRAN  ODT) 4 MG disintegrating tablet Take 1 tablet (4 mg total) by mouth every 8 (eight) hours as needed for nausea or vomiting. 10/27/20   Avegno, Komlanvi S, FNP    Family History Family History  Problem Relation Age of Onset   Heart disease Mother    Healthy Father     Social History Social History   Tobacco Use   Smoking status: Never   Smokeless tobacco: Never  Substance Use Topics    Alcohol use: Yes    Comment: weekly   Drug use: Never     Allergies   Patient has no known allergies.   Review of Systems Review of Systems Per HPI  Physical Exam Triage Vital Signs ED Triage Vitals [10/01/23 1203]  Encounter Vitals Group     BP 111/73     Systolic BP Percentile      Diastolic BP Percentile      Pulse Rate (!) 125     Resp 17     Temp 98.2 F (36.8 C)     Temp Source Oral     SpO2 94 %     Weight      Height      Head Circumference      Peak Flow      Pain Score 0     Pain Loc      Pain Education      Exclude from Growth Chart    No data found.  Updated Vital Signs BP 111/73 (BP Location: Right Arm)   Pulse (!) 125   Temp 98.2 F (36.8 C) (Oral)   Resp 17   SpO2 94%   Visual Acuity Right Eye Distance:   Left Eye Distance:   Bilateral Distance:    Right Eye Near:   Left Eye Near:  Bilateral Near:     Physical Exam Vitals and nursing note reviewed.  Constitutional:      Appearance: Normal appearance. She is not ill-appearing.  HENT:     Head: Atraumatic.     Right Ear: Tympanic membrane normal.     Left Ear: Tympanic membrane normal.     Nose: Nose normal.     Mouth/Throat:     Mouth: Mucous membranes are moist.     Pharynx: Oropharynx is clear. No posterior oropharyngeal erythema.  Eyes:     Extraocular Movements: Extraocular movements intact.     Conjunctiva/sclera: Conjunctivae normal.  Cardiovascular:     Rate and Rhythm: Normal rate and regular rhythm.     Heart sounds: Normal heart sounds.  Pulmonary:     Effort: Pulmonary effort is normal.     Breath sounds: Normal breath sounds. No wheezing or rales.  Abdominal:     General: Bowel sounds are normal. There is no distension.     Palpations: Abdomen is soft.     Tenderness: There is no abdominal tenderness. There is no guarding.  Musculoskeletal:        General: Normal range of motion.     Cervical back: Normal range of motion and neck supple.  Skin:    General:  Skin is warm and dry.     Findings: No rash.  Neurological:     General: No focal deficit present.     Mental Status: She is alert and oriented to person, place, and time.  Psychiatric:        Mood and Affect: Mood normal.        Thought Content: Thought content normal.        Judgment: Judgment normal.      UC Treatments / Results  Labs (all labs ordered are listed, but only abnormal results are displayed) Labs Reviewed  POCT URINALYSIS DIP (MANUAL ENTRY) - Abnormal; Notable for the following components:      Result Value   Color, UA straw (*)    Bilirubin, UA small (*)    Ketones, POC UA trace (5) (*)    Spec Grav, UA >=1.030 (*)    Protein Ur, POC =100 (*)    Leukocytes, UA Trace (*)    All other components within normal limits  URINE CULTURE  CBC WITH DIFFERENTIAL/PLATELET  COMPREHENSIVE METABOLIC PANEL  SPOTTED FEVER GROUP ANTIBODIES  LYME DISEASE SEROLOGY W/REFLEX    EKG   Radiology DG Chest 2 View Result Date: 10/01/2023 CLINICAL DATA:  Fever and chest congestion for several weeks. EXAM: CHEST - 2 VIEW COMPARISON:  None Available. FINDINGS: The heart size and mediastinal contours are within normal limits. Both lungs are clear. The visualized skeletal structures are unremarkable. IMPRESSION: No active cardiopulmonary disease. Electronically Signed   By: Norleen DELENA Kil M.D.   On: 10/01/2023 12:54    Procedures Procedures (including critical care time)  Medications Ordered in UC Medications - No data to display  Initial Impression / Assessment and Plan / UC Course  I have reviewed the triage vital signs and the nursing notes.  Pertinent labs & imaging results that were available during my care of the patient were reviewed by me and considered in my medical decision making (see chart for details).     Tachycardic in triage, otherwise vital signs reassuring.  Urinalysis without obvious evidence of UTI but trace leuks so urine culture pending.  Suspect urine  findings are more related to fever causing some mild dehydration.  Chest x-ray given fever of unknown origin performed, this was negative for acute cardiopulmonary abnormality.  Labs pending for further rule out and discussed importance of following up with PCP as soon as possible.  Fever control, return precautions reviewed.  Final Clinical Impressions(s) / UC Diagnoses   Final diagnoses:  Fever, unspecified  Arthralgia, unspecified joint     Discharge Instructions      We will let you know if your labs come back abnormal. Your chest x-ray was negative for pneumonia or other obvious abnormalities, urine isn't showing an obvious UTI but I have sent out a culture to further evaluate. Follow up with Primary Care as soon as possible    ED Prescriptions   None    PDMP not reviewed this encounter.   Stuart Vernell Norris, [place] 10/01/23 1401

## 2023-10-01 NOTE — Discharge Instructions (Addendum)
 We will let you know if your labs come back abnormal. Your chest x-ray was negative for pneumonia or other obvious abnormalities, urine isn't showing an obvious UTI but I have sent out a culture to further evaluate. Follow up with Primary Care as soon as possible

## 2023-10-02 LAB — CBC WITH DIFFERENTIAL/PLATELET
Basophils Absolute: 0.1 10*3/uL (ref 0.0–0.2)
Basos: 1 %
EOS (ABSOLUTE): 0 10*3/uL (ref 0.0–0.4)
Eos: 0 %
Hematocrit: 47.6 % — ABNORMAL HIGH (ref 34.0–46.6)
Hemoglobin: 15.4 g/dL (ref 11.1–15.9)
Immature Grans (Abs): 0 10*3/uL (ref 0.0–0.1)
Immature Granulocytes: 0 %
Lymphocytes Absolute: 4 10*3/uL — ABNORMAL HIGH (ref 0.7–3.1)
Lymphs: 57 %
MCH: 28.7 pg (ref 26.6–33.0)
MCHC: 32.4 g/dL (ref 31.5–35.7)
MCV: 89 fL (ref 79–97)
Monocytes Absolute: 0.5 10*3/uL (ref 0.1–0.9)
Monocytes: 7 %
Neutrophils Absolute: 2.5 10*3/uL (ref 1.4–7.0)
Neutrophils: 35 %
Platelets: 183 10*3/uL (ref 150–450)
RBC: 5.37 x10E6/uL — ABNORMAL HIGH (ref 3.77–5.28)
RDW: 13.1 % (ref 11.7–15.4)
WBC: 7.1 10*3/uL (ref 3.4–10.8)

## 2023-10-02 LAB — COMPREHENSIVE METABOLIC PANEL
ALT: 102 [IU]/L — ABNORMAL HIGH (ref 0–32)
AST: 86 [IU]/L — ABNORMAL HIGH (ref 0–40)
Albumin: 3.8 g/dL — ABNORMAL LOW (ref 3.9–4.9)
Alkaline Phosphatase: 128 [IU]/L — ABNORMAL HIGH (ref 44–121)
BUN/Creatinine Ratio: 12 (ref 9–23)
BUN: 10 mg/dL (ref 6–20)
Bilirubin Total: 0.5 mg/dL (ref 0.0–1.2)
CO2: 21 mmol/L (ref 20–29)
Calcium: 8.6 mg/dL — ABNORMAL LOW (ref 8.7–10.2)
Chloride: 108 mmol/L — ABNORMAL HIGH (ref 96–106)
Creatinine, Ser: 0.84 mg/dL (ref 0.57–1.00)
Globulin, Total: 2.3 g/dL (ref 1.5–4.5)
Glucose: 94 mg/dL (ref 70–99)
Potassium: 4.4 mmol/L (ref 3.5–5.2)
Sodium: 144 mmol/L (ref 134–144)
Total Protein: 6.1 g/dL (ref 6.0–8.5)
eGFR: 93 mL/min/{1.73_m2} (ref >59)

## 2023-10-02 LAB — SPOTTED FEVER GROUP ANTIBODIES
Spotted Fever Group IgG: <1:64 {titer}
Spotted Fever Group IgM: <1:64 {titer}

## 2023-10-02 LAB — LYME IGG/IGM
Lyme IgG EIA: NEGATIVE
Lyme IgM EIA: POSITIVE
Lyme Interpretation: DETECTED — AB

## 2023-10-02 LAB — URINE CULTURE: Culture: NO GROWTH

## 2023-10-02 LAB — LYME DISEASE SEROLOGY W/REFLEX: Lyme Total Antibody EIA: POSITIVE

## 2023-10-03 ENCOUNTER — Telehealth: Payer: No Typology Code available for payment source

## 2023-10-03 ENCOUNTER — Telehealth: Payer: Self-pay | Admitting: Emergency Medicine

## 2023-10-03 DIAGNOSIS — B948 Sequelae of other specified infectious and parasitic diseases: Secondary | ICD-10-CM

## 2023-10-03 MED ORDER — DOXYCYCLINE HYCLATE 100 MG PO TABS: 100.0000 mg | ORAL_TABLET | Freq: Two times a day (BID) | ORAL | 0 refills | Status: AC

## 2023-10-03 NOTE — Telephone Encounter (Signed)
 Patient was found to have Lyme IgM, symptoms of fever, headache, fatigue, body aches and joint pain x 2 weeks.  Patient provided with a 30-day prescription of doxycycline  100 mg twice daily.  Clinical staff to notify patient.  Other labs performed that day include urine culture, RMSF, both of which were negative, CBC and metabolic panel findings are consistent with Lyme disease.

## 2023-10-16 ENCOUNTER — Telehealth: Payer: No Typology Code available for payment source | Admitting: Physician Assistant

## 2023-10-16 ENCOUNTER — Encounter: Payer: Self-pay | Admitting: Physician Assistant

## 2023-10-16 DIAGNOSIS — B349 Viral infection, unspecified: Secondary | ICD-10-CM | POA: Diagnosis not present

## 2023-10-16 DIAGNOSIS — A6920 Lyme disease, unspecified: Secondary | ICD-10-CM

## 2023-10-16 NOTE — Progress Notes (Signed)
Virtual Visit Consent   Kelly Holt, you are scheduled for a virtual visit with a [facility] provider today. Just as with appointments in the office, your consent must be obtained to participate. Your consent will be active for this visit and any virtual visit you may have with one of our providers in the next 365 days. If you have a MyChart account, a copy of this consent can be sent to you electronically.  As this is a virtual visit, video technology does not allow for your provider to perform a traditional examination. This may limit your provider's ability to fully assess your condition. If your provider identifies any concerns that need to be evaluated in person or the need to arrange testing (such as labs, EKG, etc.), we will make arrangements to do so. Although advances in technology are sophisticated, we cannot ensure that it will always work on either your end or our end. If the connection with a video visit is poor, the visit may have to be switched to a telephone visit. With either a video or telephone visit, we are not always able to ensure that we have a secure connection.  By engaging in this virtual visit, you consent to the provision of healthcare and authorize for your insurance to be billed (if applicable) for the services provided during this visit. Depending on your insurance coverage, you may receive a charge related to this service.  I need to obtain your verbal consent now. Are you willing to proceed with your visit today? Kelly Holt has provided verbal consent on 10/16/2023 for a virtual visit (video or telephone). Kelly Loveless, PA-C  Date: 10/16/2023 2:28 PM  Virtual Visit via Video Note   I, Kelly Holt, connected with  Kelly Holt  ([id number], 31-Aug-1991) on 10/16/23 at  8:00 AM EST by a video-enabled telemedicine application and verified that I am speaking with the correct person using two identifiers.  Location: Patient: Virtual Visit Location Patient:  Home Provider: Virtual Visit Location Provider: Home Office   I discussed the limitations of evaluation and management by telemedicine and the availability of in person appointments. The patient expressed understanding and agreed to proceed.    History of Present Illness: Kelly Holt is a 33 y.o. who identifies as a female who was assigned female at birth, and is being seen today for body aches, fever, fatigue.  Was seen on 10/01/23 at [facility] for similar issue. Found to have Lyme IgM and started on Doxycycline 100mg  BID x 30 days.   Had been having symptom improvement, but on Saturday, 10/14/23, developed muscle aches and pain, fatigue, headache, sore throat, and fever 102. Continues Doxycycline for Lyme. Has taken Tylenol and Ibuprofen for fevers and pain.   Denies any known sick contact.   Has not tested for Influenza or Covid.   Problems: There are no active problems to display for this patient.   Allergies: No Known Allergies Medications:  Current Outpatient Medications:    cetirizine (ZYRTEC) 10 MG tablet, Take 1 tablet (10 mg total) by mouth daily., Disp: 30 tablet, Rfl: 0   doxycycline (VIBRA-TABS) 100 MG tablet, Take 1 tablet (100 mg total) by mouth 2 (two) times daily., Disp: 60 tablet, Rfl: 0   FLUoxetine (PROZAC) 20 MG tablet, Take 20 mg by mouth daily., Disp: , Rfl:    ibuprofen (ADVIL) 800 MG tablet, Take 1 tablet (800 mg total) by mouth 3 (three) times daily., Disp: 30 tablet, Rfl: 0   ondansetron (ZOFRAN ODT)  4 MG disintegrating tablet, Take 1 tablet (4 mg total) by mouth every 8 (eight) hours as needed for nausea or vomiting., Disp: 30 tablet, Rfl: 0  Observations/Objective: Patient is well-developed, well-nourished in no acute distress.  Resting comfortably at home.  Head is normocephalic, atraumatic.  No labored breathing.  Speech is clear and coherent with logical content.  Patient is alert and oriented at baseline.  Reports throat is erythematous and does have post  nasal drainage but denies any exudate  Assessment and Plan: 1. Viral illness (Primary)  2. Lyme disease  - Suspect secondary viral illness superimposed on active Lyme disease - Continue Doxycycline - Covid and Flu test at home; Patient reported back Covid + flu negative  - OTC symptomatic management of choice  - Salt water gargles - Push fluids - Rest - Seek in person evaluation if symptoms progress or worsen   Follow Up Instructions: I discussed the assessment and treatment plan with the patient. The patient was provided an opportunity to ask questions and all were answered. The patient agreed with the plan and demonstrated an understanding of the instructions.  A copy of instructions were sent to the patient via MyChart unless otherwise noted below.    The patient was advised to call back or seek an in-person evaluation if the symptoms worsen or if the condition fails to improve as anticipated.    Kelly Loveless, PA-C

## 2023-10-16 NOTE — Patient Instructions (Signed)
Nell Range, thank you for joining Margaretann Loveless, PA-C for today's virtual visit.  While this provider is not your primary care provider (PCP), if your PCP is located in our provider database this encounter information will be shared with them immediately following your visit.   A [facility] MyChart account gives you access to today's visit and all your visits, tests, and labs performed at [facility] " click here if you don't have a [facility] MyChart account or go to [email or web address]  Consent: (Patient) Kelly Holt provided verbal consent for this virtual visit at the beginning of the encounter.  Current Medications:  Current Outpatient Medications:    cetirizine (ZYRTEC) 10 MG tablet, Take 1 tablet (10 mg total) by mouth daily., Disp: 30 tablet, Rfl: 0   doxycycline (VIBRA-TABS) 100 MG tablet, Take 1 tablet (100 mg total) by mouth 2 (two) times daily., Disp: 60 tablet, Rfl: 0   FLUoxetine (PROZAC) 20 MG tablet, Take 20 mg by mouth daily., Disp: , Rfl:    ibuprofen (ADVIL) 800 MG tablet, Take 1 tablet (800 mg total) by mouth 3 (three) times daily., Disp: 30 tablet, Rfl: 0   ondansetron (ZOFRAN ODT) 4 MG disintegrating tablet, Take 1 tablet (4 mg total) by mouth every 8 (eight) hours as needed for nausea or vomiting., Disp: 30 tablet, Rfl: 0   Medications ordered in this encounter:  No orders of the defined types were placed in this encounter.    *If you need refills on other medications prior to your next appointment, please contact your pharmacy*  Follow-Up: Call back or seek an in-person evaluation if the symptoms worsen or if the condition fails to improve as anticipated.   Virtual Care [phone]  Other Instructions  Lyme Disease Lyme disease is an infection that can affect many parts of the body, including the skin, joints, and nervous system. It is an infection that starts from the bite of an infected tick. Over time, the infection can  worsen, and some of the symptoms are like those of the flu. If Lyme disease is not treated, it may cause joint pain, swelling, numbness, problems thinking, tiredness (fatigue), muscle weakness, and other problems. What are the causes? This condition is caused by a germ (bacteria) called Borrelia burgdorferi. You can get Lyme disease by being bitten by an infected tick. Only black-legged, or Ixodes, ticks that are infected with the germ can cause Lyme disease. The tick must be attached to your skin for a certain period of time to pass along the infection. This is usually 36-48 hours. Deer often carry infected ticks. What increases the risk? The following factors may make you more likely to develop this condition: Living in or visiting these areas in the [place]. [place]. The [address] Spending time in wooded or grassy areas. Being outdoors with your skin not covered. Camping, gardening, hiking, fishing, hunting, or working outdoors. Failing to remove a tick from your skin. What are the signs or symptoms? Early symptoms of this condition may include: Chills and fever. Headache. Fatigue. General achiness. Joint or muscle pain. Swollen lymph glands. A red or purple rash that surrounds the center of the tick bite. The center of the rash may be blood colored or have tiny blisters. Later symptoms may vary depending on the affected body part. They may include: Weakness or drooping on one side of the face (facial palsy). The rash spreading or appearing on other parts of the body. Severe joint  pain and swelling, often in the knees and other large joints. Irregular heartbeats (palpitations). Feeling light-headed or having shortness of breath. Memory problems or trouble concentrating. Pain, numbness, or tingling in the hands or feet. Stiff neck. In some cases, mental health symptoms may also appear, such as depression and feeling worried or nervous. How is this  diagnosed? This condition is diagnosed based on: Your symptoms and medical history. A physical exam. Blood tests. How is this treated? The main treatment for this condition is antibiotics. This medicine is usually taken by mouth (orally). The length of treatment depends on how soon after a tick bite you begin taking the medicine. In some cases, treatment is needed for several weeks. If the infection is severe, antibiotics may need to be given through an IV that is inserted into one of your veins. Other treatments will be based on the symptoms that you have. Talk with your health care provider about other treatments. Follow these instructions at home: Take over-the-counter and prescription medicines as told by your provider. Finish your antibiotics even if you start to feel better. Ask your provider about taking a probiotic in between doses of your antibiotic to help avoid an upset stomach or diarrhea. Maintain a healthy lifestyle. This includes: Eating a healthy diet. Getting enough sleep. Doing exercises as told by your provider. Consider joining a support group. Keep all follow-up visits. Your provider will check whether the treatments work and help you manage symptoms. How is this prevented? You can become infected again if you get another tick bite from an infected tick. Take these steps to help prevent this: Cover your skin with light-colored clothing when you are outdoors in the spring and summer months. Spray clothing and skin with bug spray. The spray should be 20-30% DEET. You can also treat camping gear, boots, and clothing with permethrin. Let it dry before you wear it. Do not apply permethrin directly to your skin. Always follow the instructions that come with bug spray or insecticide. Avoid wooded, grassy, and shaded areas. Remove yard litter, brush, trash, and plants that attract deer and rodents. Check yourself for ticks when you come indoors. Shower and wash clothing after  spending time outdoors. Check your pets for ticks before they come inside. If you find a tick attached to your skin: Remove it with tweezers. Clean your hands and the bite area with rubbing alcohol or soap and water. Dispose of the tick by putting it in rubbing alcohol, putting it in a sealed bag or container, or flushing it down the toilet. You may choose to save the tick in a sealed container if you wish for it to be tested at a later time. Pregnant women should take special care to avoid tick bites because it is possible that the infection may be passed along to the fetus. Where to find support Global Lyme Alliance: globallymealliance.org LymeLight Foundation: lymelightfoundation.org Where to find more information Centers for Disease Control and Prevention: TonerPromos.no Contact a health care provider if: You have new symptoms. Your symptoms get worse. You have symptoms after treatment. You have removed a tick and want to bring it to your provider for testing. Get help right away if: You have chest pain. You develop the following: A stiff neck. A severe headache. Severe nausea and vomiting. Sensitivity to light. These symptoms may be an emergency. Get help right away. Call 911. Do not wait to see if the symptoms will go away. Do not drive yourself to the hospital. This information  is not intended to replace advice given to you by your health care provider. Make sure you discuss any questions you have with your health care provider. Document Revised: 05/09/2022 Document Reviewed: 05/09/2022 Elsevier Patient Education  2024 Elsevier Inc.   If you have been instructed to have an in-person evaluation today at a local Urgent Care facility, please use the link below. It will take you to a list of all of our available [facility], including address, phone number and hours of operation. Please do not delay care.  [facility]  If you or a family member do not have a  primary care provider, use the link below to schedule a visit and establish care. When you choose a [facility] primary care physician or advanced practice provider, you gain a long-term partner in health. Find a Primary Care Provider  Learn more about [facility]'s in-office and virtual care options: [facility] - Get Care Now

## 2023-10-17 ENCOUNTER — Ambulatory Visit
Admission: EM | Admit: 2023-10-17 | Discharge: 2023-10-17 | Disposition: A | Discharge status: Home / Self Care | Payer: No Typology Code available for payment source | Attending: Nurse Practitioner | Admitting: Nurse Practitioner

## 2023-10-17 DIAGNOSIS — J101 Influenza due to other identified influenza virus with other respiratory manifestations: Secondary | ICD-10-CM | POA: Insufficient documentation

## 2023-10-17 DIAGNOSIS — Z1152 Encounter for screening for COVID-19: Secondary | ICD-10-CM | POA: Diagnosis present

## 2023-10-17 HISTORY — DX: Lyme disease, unspecified: A69.20

## 2023-10-17 LAB — POCT INFLUENZA A/B
Influenza A, POC: POSITIVE — AB
Influenza B, POC: NEGATIVE

## 2023-10-17 LAB — POCT RAPID STREP A (OFFICE): Rapid Strep A Screen: NEGATIVE

## 2023-10-17 NOTE — ED Triage Notes (Signed)
Pt reports cough, nasal congestion, headache, body aches, x 4 days wanting flu strep and COVID testing.

## 2023-10-17 NOTE — Discharge Instructions (Addendum)
You tested positive for influenza A.  The rapid strep test was negative.  A throat culture and COVID test are pending.  You will be contacted if the pending test results are positive.  You also have access to the results via MyChart. Continue over-the-counter Tylenol or ibuprofen as needed for pain, fever, or general discomfort. May use normal saline nasal spray throughout the day for nasal congestion and runny nose. For your cough, recommend using a humidifier at nighttime during sleep and sleeping elevated on pillows while cough symptoms persist. If symptoms fail to improve over the next 5 to 7 days, please follow-up with your primary care physician for further evaluation. Follow-up as needed.

## 2023-10-17 NOTE — ED Provider Notes (Signed)
[facility]    CSN: [id number] Arrival date & time: 10/17/23  4098      History   Chief Complaint No chief complaint on file.   HPI Kelly Holt is a 33 y.o. female.   The history is provided by the patient.   Patient presents with a 4-day history of bodyaches, cough, nasal congestion, and headaches.  Denies fever, chills, ear pain, ear drainage, wheezing, difficulty breathing, chest pain, abdominal pain, nausea, vomiting, diarrhea, or rash.  Patient reports that she recently tested positive for Lyme disease.  She is currently taking doxycycline 100 mg twice daily for the next 30 days.  Reports she has been taking over-the-counter cough and cold medication for her symptoms.  Past Medical History:  Diagnosis Date   Anxiety    Depression    Lyme disease     There are no active problems to display for this patient.   Past Surgical History:  Procedure Laterality Date   TONSILLECTOMY      OB History   No obstetric history on file.      Home Medications    Prior to Admission medications   Medication Sig Start Date End Date Taking? Authorizing Provider  cetirizine (ZYRTEC) 10 MG tablet Take 1 tablet (10 mg total) by mouth daily. 04/15/20   Wurst, [place], PA-C  doxycycline (VIBRA-TABS) 100 MG tablet Take 1 tablet (100 mg total) by mouth 2 (two) times daily. 10/03/23 11/02/23  Theadora Rama Scales, PA-C  FLUoxetine (PROZAC) 20 MG tablet Take 20 mg by mouth daily.    [provider]  ibuprofen (ADVIL) 800 MG tablet Take 1 tablet (800 mg total) by mouth 3 (three) times daily. 06/22/20   Avegno, Zachery Dakins, FNP  ondansetron (ZOFRAN ODT) 4 MG disintegrating tablet Take 1 tablet (4 mg total) by mouth every 8 (eight) hours as needed for nausea or vomiting. 10/27/20   Avegno, Zachery Dakins, FNP    Family History Family History  Problem Relation Age of Onset   Heart disease Mother    Healthy Father     Social History Social History   Tobacco Use   Smoking  status: Never   Smokeless tobacco: Never  Substance Use Topics   Alcohol use: Yes    Comment: weekly   Drug use: Never     Allergies   Patient has no known allergies.   Review of Systems Review of Systems Per HPI  Physical Exam Triage Vital Signs ED Triage Vitals  Encounter Vitals Group     BP 10/17/23 0858 118/84     Systolic BP Percentile --      Diastolic BP Percentile --      Pulse Rate 10/17/23 0858 (!) 109     Resp 10/17/23 0858 18     Temp 10/17/23 0858 98.4 F (36.9 C)     Temp Source 10/17/23 0858 Oral     SpO2 10/17/23 0858 96 %     Weight --      Height --      Head Circumference --      Peak Flow --      Pain Score 10/17/23 0902 7     Pain Loc --      Pain Education --      Exclude from Growth Chart --    No data found.  Updated Vital Signs BP 118/84 (BP Location: Right Arm)   Pulse (!) 109   Temp 98.4 F (36.9 C) (Oral)   Resp 18  LMP 09/20/2023 (Within Days)   SpO2 96%   Visual Acuity Right Eye Distance:   Left Eye Distance:   Bilateral Distance:    Right Eye Near:   Left Eye Near:    Bilateral Near:     Physical Exam Vitals and nursing note reviewed.  Constitutional:      General: She is not in acute distress.    Appearance: Normal appearance.  HENT:     Head: Normocephalic.     Right Ear: Tympanic membrane, ear canal and external ear normal.     Left Ear: Tympanic membrane, ear canal and external ear normal.     Nose: Congestion present.     Right Turbinates: Enlarged and swollen.     Left Turbinates: Enlarged and swollen.     Right Sinus: No maxillary sinus tenderness or frontal sinus tenderness.     Left Sinus: No maxillary sinus tenderness or frontal sinus tenderness.     Mouth/Throat:     Lips: Pink.     Mouth: Mucous membranes are moist.     Pharynx: Uvula midline. Postnasal drip present. No pharyngeal swelling, oropharyngeal exudate, posterior oropharyngeal erythema or uvula swelling.  Eyes:     Extraocular  Movements: Extraocular movements intact.     Conjunctiva/sclera: Conjunctivae normal.     Pupils: Pupils are equal, round, and reactive to light.  Cardiovascular:     Rate and Rhythm: Regular rhythm. Tachycardia present.     Pulses: Normal pulses.     Heart sounds: Normal heart sounds.  Pulmonary:     Effort: Pulmonary effort is normal. No respiratory distress.     Breath sounds: Normal breath sounds. No stridor. No wheezing, rhonchi or rales.  Abdominal:     General: Bowel sounds are normal.     Palpations: Abdomen is soft.     Tenderness: There is no abdominal tenderness.  Musculoskeletal:     Cervical back: Normal range of motion.  Lymphadenopathy:     Cervical: No cervical adenopathy.  Skin:    General: Skin is warm and dry.  Neurological:     General: No focal deficit present.     Mental Status: She is alert and oriented to person, place, and time.  Psychiatric:        Mood and Affect: Mood normal.        Behavior: Behavior normal.      UC Treatments / Results  Labs (all labs ordered are listed, but only abnormal results are displayed) Labs Reviewed  POCT INFLUENZA A/B - Abnormal; Notable for the following components:      Result Value   Influenza A, POC Positive (*)    All other components within normal limits  POCT RAPID STREP A (OFFICE) - Normal    EKG   Radiology No results found.  Procedures Procedures (including critical care time)  Medications Ordered in UC Medications - No data to display  Initial Impression / Assessment and Plan / UC Course  I have reviewed the triage vital signs and the nursing notes.  Pertinent labs & imaging results that were available during my care of the patient were reviewed by me and considered in my medical decision making (see chart for details).  The rapid strep test was negative, influenza test was positive for influenza A.  Patient's symptoms started 4 days ago, she is out of the window to receive Tamiflu.   Supportive care recommendations were provided and discussed with the patient to include continuing over-the-counter cough and cold  medications, over-the-counter analgesics, fluids, and rest.  Discussed indications with the patient regarding follow-up.  Patient was in agreement with this plan of care and verbalizes understanding.  All questions were answered.  Patient stable for discharge.  Final Clinical Impressions(s) / UC Diagnoses   Final diagnoses:  Influenza A     Discharge Instructions      You tested positive for influenza A.  The rapid strep test was negative.  A throat culture and COVID test are pending.  You will be contacted if the pending test results are positive.  You also have access to the results via MyChart. Continue over-the-counter Tylenol or ibuprofen as needed for pain, fever, or general discomfort. May use normal saline nasal spray throughout the day for nasal congestion and runny nose. For your cough, recommend using a humidifier at nighttime during sleep and sleeping elevated on pillows while cough symptoms persist. If symptoms fail to improve over the next 5 to 7 days, please follow-up with your primary care physician for further evaluation. Follow-up as needed.     ED Prescriptions   None    PDMP not reviewed this encounter.   Abran Cantor, NP 10/17/23 1010

## 2023-10-18 LAB — SARS CORONAVIRUS 2 (TAT 6-24 HRS): SARS Coronavirus 2: NEGATIVE

## 2023-12-04 ENCOUNTER — Ambulatory Visit: Payer: No Typology Code available for payment source | Admitting: Family Medicine

## 2024-05-01 ENCOUNTER — Ambulatory Visit
Admission: EM | Admit: 2024-05-01 | Discharge: 2024-05-01 | Disposition: A | Discharge status: Home / Self Care | Payer: Self-pay | Attending: Family Medicine | Admitting: Family Medicine

## 2024-05-01 DIAGNOSIS — U071 COVID-19: Secondary | ICD-10-CM

## 2024-05-01 LAB — POC SOFIA SARS ANTIGEN FIA: SARS Coronavirus 2 Ag: POSITIVE — AB

## 2024-05-01 MED ORDER — PAXLOVID (300/100) 20 X 150 MG & 10 X 100MG PO TBPK: 3.0000 | ORAL_TABLET | Freq: Two times a day (BID) | ORAL | 0 refills | Status: AC

## 2024-05-01 NOTE — ED Triage Notes (Signed)
 Pt reports sore throat, headache ear ache, joint pain, and body aches x 4 days. Pt has used DayQuil/Nyquil but has fond no relief.

## 2024-05-01 NOTE — ED Provider Notes (Signed)
 [facility]    CSN: [id number] Arrival date & time: 05/01/24  1604      History   Chief Complaint No chief complaint on file.   HPI Kelly Holt is a 33 y.o. female.   Patient presenting today with 4-day history of generalized body aches, chills, mild cough and congestion.  Denies fever, chest pain, shortness of breath, abdominal pain, vomiting, diarrhea.  So far trying DayQuil with minimal relief.  Son sick with similar symptoms.  No known history of chronic pulmonary disease.    Past Medical History:  Diagnosis Date   Anxiety    Depression    Lyme disease     There are no active problems to display for this patient.   Past Surgical History:  Procedure Laterality Date   TONSILLECTOMY      OB History   No obstetric history on file.      Home Medications    Prior to Admission medications   Medication Sig Start Date End Date Taking? Authorizing Provider  nirmatrelvir/ritonavir (PAXLOVID , 300/100,) 20 x 150 MG & 10 x 100MG  TBPK Take 3 tablets by mouth 2 (two) times daily for 5 days. Patient GFR is >60. Take nirmatrelvir (150 mg) two tablets twice daily for 5 days and ritonavir (100 mg) one tablet twice daily for 5 days. 05/01/24 05/06/24 Yes Stuart Vernell Norris, PA-C  cetirizine  (ZYRTEC ) 10 MG tablet Take 1 tablet (10 mg total) by mouth daily. 04/15/20   Wurst, [place], PA-C  FLUoxetine (PROZAC) 20 MG tablet Take 20 mg by mouth daily.    [provider]  ibuprofen  (ADVIL ) 800 MG tablet Take 1 tablet (800 mg total) by mouth 3 (three) times daily. 06/22/20   Avegno, Komlanvi S, FNP  ondansetron  (ZOFRAN  ODT) 4 MG disintegrating tablet Take 1 tablet (4 mg total) by mouth every 8 (eight) hours as needed for nausea or vomiting. 10/27/20   Avegno, Komlanvi S, FNP    Family History Family History  Problem Relation Age of Onset   Heart disease Mother    Healthy Father     Social History Social History   Tobacco Use   Smoking status: Never    Smokeless tobacco: Never  Substance Use Topics   Alcohol use: Yes    Comment: weekly   Drug use: Never     Allergies   Patient has no known allergies.   Review of Systems Review of Systems PER HPI  Physical Exam Triage Vital Signs ED Triage Vitals  Encounter Vitals Group     BP 05/01/24 1609 134/83     Girls Systolic BP Percentile --      Girls Diastolic BP Percentile --      Boys Systolic BP Percentile --      Boys Diastolic BP Percentile --      Pulse Rate 05/01/24 1609 (!) 115     Resp 05/01/24 1609 18     Temp 05/01/24 1609 98.5 F (36.9 C)     Temp Source 05/01/24 1609 Oral     SpO2 05/01/24 1609 98 %     Weight --      Height --      Head Circumference --      Peak Flow --      Pain Score 05/01/24 1611 6     Pain Loc --      Pain Education --      Exclude from Growth Chart --    No data found.  Updated Vital  Signs BP 134/83 (BP Location: Right Arm)   Pulse (!) 115   Temp 98.5 F (36.9 C) (Oral)   Resp 18   LMP 04/19/2024 (Exact Date)   SpO2 98%   Visual Acuity Right Eye Distance:   Left Eye Distance:   Bilateral Distance:    Right Eye Near:   Left Eye Near:    Bilateral Near:     Physical Exam Vitals and nursing note reviewed.  Constitutional:      Appearance: Normal appearance.  HENT:     Head: Atraumatic.     Right Ear: Tympanic membrane and external ear normal.     Left Ear: Tympanic membrane and external ear normal.     Nose: Rhinorrhea present.     Mouth/Throat:     Mouth: Mucous membranes are moist.     Pharynx: No posterior oropharyngeal erythema.  Eyes:     Extraocular Movements: Extraocular movements intact.     Conjunctiva/sclera: Conjunctivae normal.  Cardiovascular:     Rate and Rhythm: Normal rate and regular rhythm.     Heart sounds: Normal heart sounds.  Pulmonary:     Effort: Pulmonary effort is normal.     Breath sounds: Normal breath sounds. No wheezing or rales.  Musculoskeletal:        General: Normal range of  motion.     Cervical back: Normal range of motion and neck supple.  Skin:    General: Skin is warm and dry.  Neurological:     Mental Status: She is alert and oriented to person, place, and time.  Psychiatric:        Mood and Affect: Mood normal.        Thought Content: Thought content normal.      UC Treatments / Results  Labs (all labs ordered are listed, but only abnormal results are displayed) Labs Reviewed  POC SOFIA SARS ANTIGEN FIA - Abnormal; Notable for the following components:      Result Value   SARS Coronavirus 2 Ag Positive (*)    All other components within normal limits    EKG   Radiology No results found.  Procedures Procedures (including critical care time)  Medications Ordered in UC Medications - No data to display  Initial Impression / Assessment and Plan / UC Course  I have reviewed the triage vital signs and the nursing notes.  Pertinent labs & imaging results that were available during my care of the patient were reviewed by me and considered in my medical decision making (see chart for details).     Mildly tachycardic in triage, otherwise vital signs within normal limits.  She is well-appearing and in no acute distress.  Rapid COVID-positive.  Will treat with Paxlovid , supportive over-the-counter medications and home care.  Return for worsening symptoms.  Note given.  Final Clinical Impressions(s) / UC Diagnoses   Final diagnoses:  COVID-19   Discharge Instructions   None    ED Prescriptions     Medication Sig Dispense Auth. Provider   nirmatrelvir/ritonavir (PAXLOVID , 300/100,) 20 x 150 MG & 10 x 100MG  TBPK Take 3 tablets by mouth 2 (two) times daily for 5 days. Patient GFR is >60. Take nirmatrelvir (150 mg) two tablets twice daily for 5 days and ritonavir (100 mg) one tablet twice daily for 5 days. 30 tablet Stuart Vernell Norris, [place]      PDMP not reviewed this encounter.   Stuart Vernell Norris, [place] 05/01/24 1649
